# Patient Record
Sex: Female | Born: 1995 | Race: Black or African American | Hispanic: No | Marital: Single
Health system: Midwestern US, Community
[De-identification: ages and names within clinical notes are randomized; demographics above are authoritative.]

## PROBLEM LIST (undated history)

## (undated) DIAGNOSIS — D649 Anemia, unspecified: Secondary | ICD-10-CM

## (undated) DIAGNOSIS — J45909 Unspecified asthma, uncomplicated: Secondary | ICD-10-CM

## (undated) HISTORY — DX: Unspecified asthma, uncomplicated: J45.909

---

## 2016-11-12 ENCOUNTER — Emergency Department (HOSPITAL_COMMUNITY): Payer: Medicaid Other

## 2016-11-12 ENCOUNTER — Emergency Department (HOSPITAL_COMMUNITY)
Admission: EM | Admit: 2016-11-12 | Discharge: 2016-11-13 | Disposition: A | Discharge status: Home / Self Care | Payer: Medicaid Other | Attending: Emergency Medicine | Admitting: Emergency Medicine

## 2016-11-12 ENCOUNTER — Encounter (HOSPITAL_COMMUNITY): Payer: Self-pay

## 2016-11-12 DIAGNOSIS — R0602 Shortness of breath: Secondary | ICD-10-CM

## 2016-11-12 DIAGNOSIS — R7989 Other specified abnormal findings of blood chemistry: Secondary | ICD-10-CM

## 2016-11-12 DIAGNOSIS — O9989 Other specified diseases and conditions complicating pregnancy, childbirth and the puerperium: Secondary | ICD-10-CM | POA: Insufficient documentation

## 2016-11-12 DIAGNOSIS — Z3A09 9 weeks gestation of pregnancy: Secondary | ICD-10-CM | POA: Diagnosis not present

## 2016-11-12 DIAGNOSIS — R791 Abnormal coagulation profile: Secondary | ICD-10-CM | POA: Insufficient documentation

## 2016-11-12 DIAGNOSIS — R071 Chest pain on breathing: Secondary | ICD-10-CM | POA: Diagnosis not present

## 2016-11-12 HISTORY — DX: Anemia, unspecified: D64.9

## 2016-11-12 LAB — BASIC METABOLIC PANEL
Anion gap: 10 (ref 5–15)
BUN: 6 mg/dL (ref 6–20)
CHLORIDE: 105 mmol/L (ref 101–111)
CO2: 20 mmol/L — ABNORMAL LOW (ref 22–32)
CREATININE: 0.6 mg/dL (ref 0.44–1.00)
Calcium: 9.6 mg/dL (ref 8.9–10.3)
GFR calc non Af Amer: >60 mL/min (ref >60)
Glucose, Bld: 104 mg/dL — ABNORMAL HIGH (ref 65–99)
Potassium: 3.7 mmol/L (ref 3.5–5.1)
SODIUM: 135 mmol/L (ref 135–145)

## 2016-11-12 LAB — I-STAT TROPONIN, ED: TROPONIN I, POC: 0 ng/mL (ref 0.00–0.08)

## 2016-11-12 LAB — CBC
HCT: 30.1 % — ABNORMAL LOW (ref 36.0–46.0)
Hemoglobin: 9.3 g/dL — ABNORMAL LOW (ref 12.0–15.0)
MCH: 19.6 pg — ABNORMAL LOW (ref 26.0–34.0)
MCHC: 30.9 g/dL (ref 30.0–36.0)
MCV: 63.5 fL — AB (ref 78.0–100.0)
PLATELETS: 356 10*3/uL (ref 150–400)
RBC: 4.74 MIL/uL (ref 3.87–5.11)
RDW: 19.1 % — ABNORMAL HIGH (ref 11.5–15.5)
WBC: 9.3 10*3/uL (ref 4.0–10.5)

## 2016-11-12 LAB — I-STAT BETA HCG BLOOD, ED (MC, WL, AP ONLY): I-stat hCG, quantitative: >2000 m[IU]/mL — ABNORMAL HIGH (ref <5)

## 2016-11-12 LAB — D-DIMER, QUANTITATIVE: D-Dimer, Quant: 1.06 ug/mL-FEU — ABNORMAL HIGH (ref 0.00–0.50)

## 2016-11-12 NOTE — ED Provider Notes (Signed)
[facility]-EMERGENCY DEPT Provider Note   CSN: [id number] Arrival date & time: 11/12/16  1848     History   Chief Complaint Chief Complaint  Patient presents with  . Chest Pain    HPI Christine Anthony is a 21 y.o. female. Patient is a G1 P0 at approximately [redacted] weeks gestation who presents with chest pain, shortness of breath, and abdominal pain.  She just moved here from [place].  She states she is unsure how far along she is, however she reports she had an ultrasound which confirmed intrauterine pregnancy at the end of January.  She reports that her chest pain started last night.  It is located substernal and feels like a pressure.  She says it feels like her heart is beating out of her chest.  When she walks, she gets short of breath.  She has no history of any heart problems.  No history of DVT or PE.  She denies any leg swelling or leg pain.  She denies any cough, fever, congestion, or chills.  Regarding her abdominal pain, she said it is mild and along both flanks.  It has been going on for several days.  She states that about 2 weeks ago, she had some clear vaginal discharge but this is resolved.  She denies any vaginal pain, dysuria, or vaginal bleeding.    HPI  Past Medical History:  Diagnosis Date  . Anemia     There are no active problems to display for this patient.   History reviewed. No pertinent surgical history.  OB History    Gravida Para Term Preterm AB Living   2 0 0 0 0 0   SAB TAB Ectopic Multiple Live Births   0 0 0 0 0       Home Medications    Prior to Admission medications   Medication Sig Start Date End Date Taking? Authorizing Provider  Prenatal Vit-Fe Fumarate-FA (MULTIVITAMIN-PRENATAL) 27-0.8 MG TABS tablet Take 1 tablet by mouth daily at 12 noon.   Yes Historical Provider, MD    Family History No family history on file.  Social History Social History  Substance Use Topics  . Smoking status: Never Smoker  . Smokeless tobacco: Never Used    . Alcohol use No     Allergies   Patient has no known allergies.   Review of Systems Review of Systems  Constitutional: Negative for chills and fever.  HENT: Negative for ear pain and sore throat.   Eyes: Negative for pain and visual disturbance.  Respiratory: Positive for shortness of breath. Negative for cough.   Cardiovascular: Positive for chest pain. Negative for palpitations and leg swelling.  Gastrointestinal: Positive for abdominal pain and diarrhea. Negative for blood in stool, constipation, nausea and vomiting.  Genitourinary: Positive for vaginal discharge (resolved). Negative for decreased urine volume, difficulty urinating, dysuria, hematuria, vaginal bleeding and vaginal pain.  Musculoskeletal: Negative for arthralgias and back pain.  Skin: Negative for color change and rash.  Neurological: Negative for seizures and syncope.  All other systems reviewed and are negative.    Physical Exam Updated Vital Signs BP 97/71   Pulse 76   Temp 97.1 F (36.2 C) (Oral)   Resp 14   Ht 5\' 1"  (1.549 m)   Wt 57.6 kg   LMP 09/02/2016 (Exact Date)   SpO2 100%   BMI 24.00 kg/m   Physical Exam  Constitutional: She is oriented to person, place, and time. She appears well-developed and well-nourished. No distress.  HENT:  Head: Normocephalic and atraumatic.  Eyes: Conjunctivae are normal.  Neck: Neck supple.  Cardiovascular: Normal rate and regular rhythm.   No murmur heard. Pt became mildly tachycardic (105) while leaning forward  Pulmonary/Chest: Effort normal and breath sounds normal. No respiratory distress. She has no wheezes. She has no rales. She exhibits tenderness (mild tenderness to mid-chest).  Abdominal: Soft. She exhibits no distension and no mass. There is tenderness. There is no rebound and no guarding.  Very mild tenderness to RUQ and LUQ along flanks. No rebound or guarding.  Musculoskeletal: She exhibits no edema, tenderness or deformity.  Neurological:  She is alert and oriented to person, place, and time. She displays normal reflexes. No cranial nerve deficit or sensory deficit. She exhibits normal muscle tone. Coordination normal.  Skin: Skin is warm and dry.  Psychiatric: She has a normal mood and affect.  Nursing note and vitals reviewed.    ED Treatments / Results  Labs (all labs ordered are listed, but only abnormal results are displayed) Labs Reviewed  BASIC METABOLIC PANEL - Abnormal; Notable for the following:       Result Value   CO2 20 (*)    Glucose, Bld 104 (*)    All other components within normal limits  CBC - Abnormal; Notable for the following:    Hemoglobin 9.3 (*)    HCT 30.1 (*)    MCV 63.5 (*)    MCH 19.6 (*)    RDW 19.1 (*)    All other components within normal limits  D-DIMER, QUANTITATIVE (NOT AT [facility]) - Abnormal; Notable for the following:    D-Dimer, Quant 1.06 (*)    All other components within normal limits  I-STAT BETA HCG BLOOD, ED ([facility], [facility], [facility] ONLY) - Abnormal; Notable for the following:    I-stat hCG, quantitative >2,000.0 (*)    All other components within normal limits  URINALYSIS, ROUTINE W REFLEX MICROSCOPIC  HEPATIC FUNCTION PANEL  LIPASE, BLOOD  I-STAT TROPOININ, ED    EKG  EKG Interpretation  Date/Time:  Thursday November 12 2016 18:54:04 EST Ventricular Rate:  105 PR Interval:  132 QRS Duration: 84 QT Interval:  328 QTC Calculation: 433 R Axis:   68 Text Interpretation:  Sinus tachycardia Otherwise normal ECG No old tracing to compare Confirmed by WARD,  DO, KRISTEN ([id number]) on 11/13/2016 12:14:17 AM       Radiology Dg Chest 2 View  Result Date: 11/12/2016 CLINICAL DATA:  Mid sternal chest pain with shortness of breath EXAM: CHEST  2 VIEW COMPARISON:  None. FINDINGS: The heart size and mediastinal contours are within normal limits. Both lungs are clear. The visualized skeletal structures are unremarkable. IMPRESSION: No active cardiopulmonary disease. Electronically Signed   By:  Jasmine Pang M.D.   On: 11/12/2016 22:43    Procedures Procedures (including critical care time)  EMERGENCY DEPARTMENT [place] PREGNANCY "Study: Limited Ultrasound of the Pelvis for Pregnancy"  INDICATIONS:Pregnancy(required) Multiple views of the uterus and pelvic cavity were obtained in real-time with a multi-frequency probe.  APPROACH:Transabdominal  PERFORMED BY: Myself IMAGES ARCHIVED?: Yes LIMITATIONS: none PREGNANCY FREE FLUID: None GESTATIONAL AGE, ESTIMATE: [redacted]w[redacted]d by CRL FETAL HEART RATE: 176 INTERPRETATION: Intrauterine gestational sac noted and Fetal heart activity seen     Medications Ordered in ED Medications  iopamidol (ISOVUE-370) 76 % injection (100 mLs  Contrast Given 11/13/16 0028)     Initial Impression / Assessment and Plan / ED Course  I have reviewed the triage vital signs and  the nursing notes.  Pertinent labs & imaging results that were available during my care of the patient were reviewed by me and considered in my medical decision making (see chart for details).    Patient presents with chest pain, shortness of breath, and mild abdominal pain.  She has not yet seen an OB.  Bedside ultrasound confirmed intrauterine pregnancy at approximately 10 weeks and 6 day gestation.  She has had no vaginal bleeding.  She did have remote clear vaginal discharge 2 weeks ago which has since resolved.  She had reproducible chest pain on exam.  However, she had mild sinus tachycardia and a positive d-dimer.  Her chest x-ray was normal.  She is pending a CTA of her chest to rule out a Pe.  Pt signed out to Dr. Elesa MassedWard at 2330.  Pt has OB follow-up scheduled for Tuesday.  Final Clinical Impressions(s) / ED Diagnoses   Final diagnoses:  Chest pain, unspecified type  Shortness of breath  Positive D dimer    New Prescriptions New Prescriptions   No medications on file     Lennette BihariJohn Michael Synai Prettyman, MD 11/13/16 [id number]    Arby BarretteMarcy Pfeiffer, MD 11/15/16 [phone]

## 2016-11-12 NOTE — ED Notes (Signed)
Rounded on lobby.  Updates and warm blankets provided.   

## 2016-11-12 NOTE — ED Triage Notes (Addendum)
Per Pt, Pt reports central chest pain, SOB, and Heart palpitations that started two days ago. Denies any cough, congestion, N/V, but reports some diarrhea. Pt reports being [redacted] week pregnant. Complains of some abdominal pain and vaginal discharge that is yellow.

## 2016-11-13 ENCOUNTER — Emergency Department (HOSPITAL_COMMUNITY): Payer: Medicaid Other

## 2016-11-13 ENCOUNTER — Encounter (HOSPITAL_COMMUNITY): Payer: Self-pay

## 2016-11-13 LAB — HEPATIC FUNCTION PANEL
ALT: 8 U/L — AB (ref 14–54)
AST: 19 U/L (ref 15–41)
Albumin: 3.9 g/dL (ref 3.5–5.0)
Alkaline Phosphatase: 39 U/L (ref 38–126)
TOTAL PROTEIN: 8 g/dL (ref 6.5–8.1)
Total Bilirubin: 0.7 mg/dL (ref 0.3–1.2)

## 2016-11-13 LAB — URINALYSIS, ROUTINE W REFLEX MICROSCOPIC
BILIRUBIN URINE: NEGATIVE
GLUCOSE, UA: NEGATIVE mg/dL
HGB URINE DIPSTICK: NEGATIVE
KETONES UR: 5 mg/dL — AB
Leukocytes, UA: NEGATIVE
Nitrite: NEGATIVE
PROTEIN: NEGATIVE mg/dL
Specific Gravity, Urine: 1.029 (ref 1.005–1.030)
pH: 6 (ref 5.0–8.0)

## 2016-11-13 LAB — LIPASE, BLOOD: LIPASE: 65 U/L — AB (ref 11–51)

## 2016-11-13 MED ORDER — IOPAMIDOL (ISOVUE-370) INJECTION 76%
INTRAVENOUS | Status: AC
  Administered 2016-11-13: 100 mL
  Filled 2016-11-13: qty 100

## 2016-11-13 NOTE — ED Provider Notes (Signed)
2:10 AM  Assumed care from Dr. Charm BargesButler and Dr. Donnald GarrePfeiffer.  Pt is a 21 year old pregnant female who presented to the emergency department with pleuritic chest pain. CT of her chest was pending to rule out PE. Labs here unremarkable. Urine shows no sign of infection. CT of her chest shows no PE, infiltrate, edema. She appears comfortable on exam, hemodynamically stable without significant complaints. Recommended outpatient follow-up. Recommended taking Tylenol as needed at home for pain.  At this time, I do not feel there is any life-threatening condition present. I have reviewed and discussed all results (EKG, imaging, lab, urine as appropriate) and exam findings with patient/family. I have reviewed nursing notes and appropriate previous records.  I feel the patient is safe to be discharged home without further emergent workup and can continue workup as an outpatient as needed. Discussed usual and customary return precautions. Patient/family verbalize understanding and are comfortable with this plan.  Outpatient follow-up has been provided if needed. All questions have been answered.    EKG Interpretation  Date/Time:  Thursday November 12 2016 18:54:04 EST Ventricular Rate:  105 PR Interval:  132 QRS Duration: 84 QT Interval:  328 QTC Calculation: 433 R Axis:   68 Text Interpretation:  Sinus tachycardia Otherwise normal ECG No old tracing to compare Confirmed by Zarek Relph,  DO, Skylen Danielsen [phone]([id number]) on 11/13/2016 12:14:17 AM         Christine MawKristen N Aretha Levi, DO 11/13/16 0211

## 2016-11-13 NOTE — ED Notes (Signed)
Patient transported to CT via stretcher.

## 2016-11-13 NOTE — ED Notes (Signed)
Sent add on label to main lab. 

## 2016-11-13 NOTE — ED Notes (Signed)
Patient verbalized understanding of discharge instructions and denies any further needs or questions at this time. VS stable. Patient ambulatory with steady gait.  

## 2016-11-13 NOTE — Discharge Instructions (Signed)
You may take Tylenol 1000 g every 6 hours as needed for pain.   To find a primary care or specialty doctor please call [phone] or [phone] to access "[place] Find a Doctor Service."  You may also go on the [facility] website at [email or web address]  There are also multiple [facility], [facility], [facility] and [facility] practices throughout [place] that are frequently accepting new patients. You may find a clinic that is close to your home and contact them.  [facility] -  [address] [place] [phone]   [facility] -  [address] [place] [id number] [phone]   [facility] [phone]  [place] [phone]

## 2016-11-19 ENCOUNTER — Encounter: Payer: Self-pay | Admitting: Certified Nurse Midwife

## 2016-11-19 ENCOUNTER — Other Ambulatory Visit (HOSPITAL_COMMUNITY)
Admission: RE | Admit: 2016-11-19 | Discharge: 2016-11-19 | Disposition: A | Discharge status: Home / Self Care | Payer: Medicaid Other | Source: Ambulatory Visit | Attending: Certified Nurse Midwife | Admitting: Certified Nurse Midwife

## 2016-11-19 ENCOUNTER — Ambulatory Visit (INDEPENDENT_AMBULATORY_CARE_PROVIDER_SITE_OTHER): Payer: Medicaid Other | Admitting: Certified Nurse Midwife

## 2016-11-19 VITALS — BP 110/77 | HR 97 | Temp 98.7°F | Wt 127.6 lb

## 2016-11-19 DIAGNOSIS — J4520 Mild intermittent asthma, uncomplicated: Secondary | ICD-10-CM

## 2016-11-19 DIAGNOSIS — O99511 Diseases of the respiratory system complicating pregnancy, first trimester: Secondary | ICD-10-CM

## 2016-11-19 DIAGNOSIS — Z3490 Encounter for supervision of normal pregnancy, unspecified, unspecified trimester: Secondary | ICD-10-CM | POA: Diagnosis not present

## 2016-11-19 DIAGNOSIS — Z3481 Encounter for supervision of other normal pregnancy, first trimester: Secondary | ICD-10-CM

## 2016-11-19 DIAGNOSIS — O219 Vomiting of pregnancy, unspecified: Secondary | ICD-10-CM

## 2016-11-19 MED ORDER — DOXYLAMINE-PYRIDOXINE 10-10 MG PO TBEC: DELAYED_RELEASE_TABLET | ORAL | 4 refills | Status: AC

## 2016-11-19 MED ORDER — PRENATE PIXIE 10-0.6-0.4-200 MG PO CAPS: 1.0000 | ORAL_CAPSULE | Freq: Every day | ORAL | 12 refills | Status: AC

## 2016-11-19 MED ORDER — ALBUTEROL SULFATE HFA 108 (90 BASE) MCG/ACT IN AERS: 2.0000 | INHALATION_SPRAY | Freq: Four times a day (QID) | RESPIRATORY_TRACT | 1 refills | Status: AC | PRN

## 2016-11-19 NOTE — Progress Notes (Signed)
Subjective:    Christine Anthony is being seen today for her first obstetrical visit.  This is a planned pregnancy. She is at 3082w1d gestation. Her obstetrical history is significant for asthma. Relationship with FOB: significant other, living together. Patient does intend to breast feed. Pregnancy history fully reviewed.  The information documented in the HPI was reviewed and verified.  Menstrual History: OB History    Gravida Para Term Preterm AB Living   1 0 0 0 0 0   SAB TAB Ectopic Multiple Live Births   0 0 0 0 0       Patient's last menstrual period was 09/02/2016 (exact date).    Past Medical History:  Diagnosis Date  . Anemia   . Asthma     History reviewed. No pertinent surgical history.   (Not in a hospital admission) No Known Allergies  Social History  Substance Use Topics  . Smoking status: Never Smoker  . Smokeless tobacco: Never Used  . Alcohol use No    Family History  Problem Relation Age of Onset  . Congestive Heart Failure Maternal Grandmother      Review of Systems Constitutional: negative for weight loss Gastrointestinal: + for vomiting Genitourinary:negative for genital lesions and vaginal discharge and dysuria Musculoskeletal:negative for back pain Behavioral/Psych: negative for abusive relationship, depression, illegal drug usage and tobacco use    Objective:    BP 110/77   Pulse 97   Temp 98.7 F (37.1 C)   Wt 127 lb 9.6 oz (57.9 kg)   LMP 09/02/2016 (Exact Date)   BMI 24.11 kg/m  General Appearance:    Alert, cooperative, no distress, appears stated age  Head:    Normocephalic, without obvious abnormality, atraumatic  Eyes:    PERRL, conjunctiva/corneas clear, EOM's intact, fundi    benign, both eyes  Ears:    Normal TM's and external ear canals, both ears  Nose:   Nares normal, septum midline, mucosa normal, no drainage    or sinus tenderness  Throat:   Lips, mucosa, and tongue normal; teeth and gums normal  Neck:   Supple,  symmetrical, trachea midline, no adenopathy;    thyroid:  no enlargement/tenderness/nodules; no carotid   bruit or JVD  Back:     Symmetric, no curvature, ROM normal, no CVA tenderness  Lungs:     Clear to auscultation bilaterally, respirations unlabored  Chest Wall:    No tenderness or deformity   Heart:    Regular rate and rhythm, S1 and S2 normal, no murmur, rub   or gallop  Breast Exam:    No tenderness, masses, or nipple abnormality  Abdomen:     Soft, non-tender, bowel sounds active all four quadrants,    no masses, no organomegaly  Genitalia:    Normal female without lesion, discharge or tenderness  Extremities:   Extremities normal, atraumatic, no cyanosis or edema  Pulses:   2+ and symmetric all extremities  Skin:   Skin color, texture, turgor normal, no rashes or lesions  Lymph nodes:   Cervical, supraclavicular, and axillary nodes normal  Neurologic:   CNII-XII intact, normal strength, sensation and reflexes    throughout       Cervix:   Long, thick, closed and posterior.  FHR: 165 by doppler.  Size c/w dates.    Lab Review Urine pregnancy test Labs reviewed no Radiologic studies reviewed no Assessment:    Pregnancy at 3682w1d weeks     Encounter for supervision of normal pregnancy, antepartum, unspecified  gravidity - Plan: TSH, Hemoglobinopathy evaluation, Varicella zoster antibody, IgG, Culture, OB Urine, MaterniT21 PLUS Core+SCA, Hemoglobin A1c, Obstetric Panel, Including HIV, Cystic Fibrosis Mutation 97, Prenat-FeAsp-Meth-FA-DHA w/o A (PRENATE PIXIE) 10-0.6-0.4-200 MG CAPS, Cervicovaginal ancillary only  Mild intermittent asthma without complication - Plan: albuterol (PROVENTIL HFA;VENTOLIN HFA) 108 (90 Base) MCG/ACT inhaler  Nausea and vomiting during pregnancy prior to [redacted] weeks gestation - Plan: Doxylamine-Pyridoxine (DICLEGIS) 10-10 MG TBEC   Plan:      Prenatal vitamins.  Counseling provided regarding continued use of seat belts, cessation of alcohol  consumption, smoking or use of illicit drugs; infection precautions i.e., influenza/TDAP immunizations, toxoplasmosis,CMV, parvovirus, listeria and varicella; workplace safety, exercise during pregnancy; routine dental care, safe medications, sexual activity, hot tubs, saunas, pools, travel, caffeine use, fish and methlymercury, potential toxins, hair treatments, varicose veins Weight gain recommendations per IOM guidelines reviewed: underweight/BMI< 18.5--> gain 28 - 40 lbs; normal weight/BMI 18.5 - 24.9--> gain 25 - 35 lbs; overweight/BMI 25 - 29.9--> gain 15 - 25 lbs; obese/BMI >30->gain  11 - 20 lbs Problem list reviewed and updated. FIRST/CF mutation testing/NIPT/QUAD SCREEN/fragile X/Ashkenazi Jewish population testing/Spinal muscular atrophy discussed: ordered. Role of ultrasound in pregnancy discussed; fetal survey: requested. Amniocentesis discussed: not indicated.  Meds ordered this encounter  Medications  . Prenat-FeAsp-Meth-FA-DHA w/o A (PRENATE PIXIE) 10-0.6-0.4-200 MG CAPS    Sig: Take 1 tablet by mouth daily.    Dispense:  30 capsule    Refill:  12    Please process coupon: Rx BIN: [id number], RxPCN: [id number], RxGRP: [id number], RxID: [id number]  SUF: 01  . albuterol (PROVENTIL HFA;VENTOLIN HFA) 108 (90 Base) MCG/ACT inhaler    Sig: Inhale 2 puffs into the lungs every 6 (six) hours as needed for wheezing or shortness of breath.    Dispense:  18 g    Refill:  1  . Doxylamine-Pyridoxine (DICLEGIS) 10-10 MG TBEC    Sig: Take 1 tablet with breakfast and lunch.  Take 2 tablets at bedtime.    Dispense:  100 tablet    Refill:  4   Orders Placed This Encounter  Procedures  . Culture, OB Urine  . TSH  . Hemoglobinopathy evaluation  . Varicella zoster antibody, IgG  . MaterniT21 PLUS Core+SCA    Order Specific Question:   Is the patient insulin dependent?    Answer:   No    Order Specific Question:   Please enter gestational age. This should be expressed as weeks AND days, i.e. 16w 6d.  Enter weeks here. Enter days in next question.    Answer:   61    Order Specific Question:   Please enter gestational age. This should be expressed as weeks AND days, i.e. 16w 6d. Enter days here. Enter weeks in previous question.    Answer:   1    Order Specific Question:   How was gestational age calculated?    Answer:   LMP    Order Specific Question:   Please give the date of LMP OR Ultrasound OR Estimated date of delivery.    Answer:   06/09/2017    Order Specific Question:   Number of Fetuses (Type of Pregnancy):    Answer:   1    Order Specific Question:   Indications for performing the test? (please choose all that apply):    Answer:   Routine screening    Order Specific Question:   Other Indications? (Y=Yes, N=No)    Answer:   N    Order Specific Question:  If this is a repeat specimen, please indicate the reason:    Answer:   Not indicated    Order Specific Question:   Please specify the patient's race: (C=White/Caucasion, B=Black, I=Native American, A=Asian, H=Hispanic, O=Other, U=Unknown)    Answer:   B    Order Specific Question:   Donor Egg - indicate if the egg was obtained from in vitro fertilization.    Answer:   N    Order Specific Question:   Age of Egg Donor.    Answer:   3    Order Specific Question:   Prior Down Syndrome/ONTD screening during current pregnancy.    Answer:   N    Order Specific Question:   Prior First Trimester Testing    Answer:   N    Order Specific Question:   Prior Second Trimester Testing    Answer:   N    Order Specific Question:   Family History of Neural Tube Defects    Answer:   N    Order Specific Question:   Prior Pregnancy with Down Syndrome    Answer:   N    Order Specific Question:   Please give the patient's weight (in pounds)    Answer:   127  . Hemoglobin A1c  . Obstetric Panel, Including HIV  . Cystic Fibrosis Mutation 97    Follow up in 4 weeks. 50% of 30 min visit spent on counseling and coordination of care.

## 2016-11-19 NOTE — Progress Notes (Signed)
Patient is in the office for initial ob visit, reports being tired.

## 2016-11-21 LAB — CERVICOVAGINAL ANCILLARY ONLY
BACTERIAL VAGINITIS: POSITIVE — AB
CANDIDA VAGINITIS: POSITIVE — AB
Chlamydia: NEGATIVE
Neisseria Gonorrhea: NEGATIVE
Trichomonas: NEGATIVE

## 2016-11-23 ENCOUNTER — Other Ambulatory Visit: Payer: Self-pay | Admitting: Certified Nurse Midwife

## 2016-11-23 DIAGNOSIS — B9689 Other specified bacterial agents as the cause of diseases classified elsewhere: Secondary | ICD-10-CM

## 2016-11-23 DIAGNOSIS — B373 Candidiasis of vulva and vagina: Secondary | ICD-10-CM

## 2016-11-23 DIAGNOSIS — B3731 Acute candidiasis of vulva and vagina: Secondary | ICD-10-CM

## 2016-11-23 DIAGNOSIS — N760 Acute vaginitis: Principal | ICD-10-CM

## 2016-11-23 MED ORDER — TERCONAZOLE 0.8 % VA CREA: 1.0000 | TOPICAL_CREAM | Freq: Every day | VAGINAL | 0 refills | Status: AC

## 2016-11-23 MED ORDER — METRONIDAZOLE 0.75 % VA GEL: 1.0000 | Freq: Two times a day (BID) | VAGINAL | 0 refills | Status: AC

## 2016-11-24 LAB — HEMOGLOBINOPATHY EVALUATION
HEMOGLOBIN F QUANTITATION: 0 % (ref 0.0–2.0)
HGB A: 97.8 % (ref 96.4–98.8)
HGB C: 0 %
HGB S: 0 %
HGB VARIANT: 0 %
Hemoglobin A2 Quantitation: 2.2 % (ref 1.8–3.2)

## 2016-11-24 LAB — OBSTETRIC PANEL, INCLUDING HIV
Antibody Screen: NEGATIVE
BASOS ABS: 0 10*3/uL (ref 0.0–0.2)
Basos: 0 %
EOS (ABSOLUTE): 0.1 10*3/uL (ref 0.0–0.4)
EOS: 1 %
HEMATOCRIT: 32.3 % — AB (ref 34.0–46.6)
HEMOGLOBIN: 9.6 g/dL — AB (ref 11.1–15.9)
HIV SCREEN 4TH GENERATION: NONREACTIVE
Hepatitis B Surface Ag: NEGATIVE
Immature Grans (Abs): 0 10*3/uL (ref 0.0–0.1)
Immature Granulocytes: 0 %
LYMPHS ABS: 1.9 10*3/uL (ref 0.7–3.1)
LYMPHS: 24 %
MCH: 19.4 pg — AB (ref 26.6–33.0)
MCHC: 29.7 g/dL — AB (ref 31.5–35.7)
MCV: 65 fL — AB (ref 79–97)
Monocytes Absolute: 0.4 10*3/uL (ref 0.1–0.9)
Monocytes: 5 %
NEUTROS ABS: 5.6 10*3/uL (ref 1.4–7.0)
Neutrophils: 70 %
Platelets: 346 10*3/uL (ref 150–379)
RBC: 4.94 x10E6/uL (ref 3.77–5.28)
RDW: 20.5 % — AB (ref 12.3–15.4)
RH TYPE: POSITIVE
RPR Ser Ql: NONREACTIVE
Rubella Antibodies, IGG: 9.38 index (ref >0.99)
WBC: 8 10*3/uL (ref 3.4–10.8)

## 2016-11-24 LAB — TSH: TSH: 3.57 u[IU]/mL (ref 0.450–4.500)

## 2016-11-24 LAB — VARICELLA ZOSTER ANTIBODY, IGG: Varicella zoster IgG: <135 index — ABNORMAL LOW (ref >165)

## 2016-11-24 LAB — HEMOGLOBIN A1C: Est. average glucose Bld gHb Est-mCnc: <74 mg/dL

## 2016-11-26 ENCOUNTER — Other Ambulatory Visit: Payer: Self-pay | Admitting: Certified Nurse Midwife

## 2016-11-26 DIAGNOSIS — O99019 Anemia complicating pregnancy, unspecified trimester: Secondary | ICD-10-CM | POA: Insufficient documentation

## 2016-11-26 DIAGNOSIS — Z283 Underimmunization status: Secondary | ICD-10-CM

## 2016-11-26 DIAGNOSIS — O09899 Supervision of other high risk pregnancies, unspecified trimester: Secondary | ICD-10-CM

## 2016-11-26 DIAGNOSIS — Z3400 Encounter for supervision of normal first pregnancy, unspecified trimester: Secondary | ICD-10-CM

## 2016-11-26 DIAGNOSIS — O99011 Anemia complicating pregnancy, first trimester: Secondary | ICD-10-CM

## 2016-11-26 MED ORDER — FERROUS GLUCONATE 240 (27 FE) MG PO TABS: 240.0000 mg | ORAL_TABLET | Freq: Three times a day (TID) | ORAL | 5 refills | Status: AC

## 2016-11-27 ENCOUNTER — Other Ambulatory Visit: Payer: Self-pay | Admitting: Certified Nurse Midwife

## 2016-11-27 DIAGNOSIS — Z3400 Encounter for supervision of normal first pregnancy, unspecified trimester: Secondary | ICD-10-CM

## 2016-11-27 LAB — MATERNIT21 PLUS CORE+SCA
CHROMOSOME 13: NEGATIVE
CHROMOSOME 21: NEGATIVE
Chromosome 18: NEGATIVE
Y Chromosome: DETECTED

## 2016-11-27 LAB — CYSTIC FIBROSIS MUTATION 97: Interpretation: NOT DETECTED

## 2016-12-17 ENCOUNTER — Ambulatory Visit (INDEPENDENT_AMBULATORY_CARE_PROVIDER_SITE_OTHER): Payer: Medicaid Other | Admitting: Obstetrics & Gynecology

## 2016-12-17 VITALS — BP 109/75 | HR 92 | Wt 133.0 lb

## 2016-12-17 DIAGNOSIS — Z3402 Encounter for supervision of normal first pregnancy, second trimester: Secondary | ICD-10-CM

## 2016-12-17 DIAGNOSIS — Z3689 Encounter for other specified antenatal screening: Secondary | ICD-10-CM

## 2016-12-17 DIAGNOSIS — Z3400 Encounter for supervision of normal first pregnancy, unspecified trimester: Secondary | ICD-10-CM

## 2016-12-17 NOTE — Patient Instructions (Signed)
Return to clinic for any scheduled appointments or obstetric concerns, or go to [facility] for evaluation    Second Trimester of Pregnancy The second trimester is from week 14 through week 27 (months 4 through 6). The second trimester is often a time when you feel your best. Your body has adjusted to being pregnant, and you begin to feel better physically. Usually, morning sickness has lessened or quit completely, you may have more energy, and you may have an increase in appetite. The second trimester is also a time when the fetus is growing rapidly. At the end of the sixth month, the fetus is about 9 inches long and weighs about 1 pounds. You will likely begin to feel the baby move (quickening) between 16 and 20 weeks of pregnancy. Body changes during your second trimester Your body continues to go through many changes during your second trimester. The changes vary from woman to woman.  Your weight will continue to increase. You will notice your lower abdomen bulging out.  You may begin to get stretch marks on your hips, abdomen, and breasts.  You may develop headaches that can be relieved by medicines. The medicines should be approved by your health care provider.  You may urinate more often because the fetus is pressing on your bladder.  You may develop or continue to have heartburn as a result of your pregnancy.  You may develop constipation because certain hormones are causing the muscles that push waste through your intestines to slow down.  You may develop hemorrhoids or swollen, bulging veins (varicose veins).  You may have back pain. This is caused by: ? Weight gain. ? Pregnancy hormones that are relaxing the joints in your pelvis. ? A shift in weight and the muscles that support your balance.  Your breasts will continue to grow and they will continue to become tender.  Your gums may bleed and may be sensitive to brushing and flossing.  Dark spots or blotches (chloasma, mask of  pregnancy) may develop on your face. This will likely fade after the baby is born.  A dark line from your belly button to the pubic area (linea nigra) may appear. This will likely fade after the baby is born.  You may have changes in your hair. These can include thickening of your hair, rapid growth, and changes in texture. Some women also have hair loss during or after pregnancy, or hair that feels dry or thin. Your hair will most likely return to normal after your baby is born.  What to expect at prenatal visits During a routine prenatal visit:  You will be weighed to make sure you and the fetus are growing normally.  Your blood pressure will be taken.  Your abdomen will be measured to track your baby's growth.  The fetal heartbeat will be listened to.  Any test results from the previous visit will be discussed.  Your health care provider may ask you:  How you are feeling.  If you are feeling the baby move.  If you have had any abnormal symptoms, such as leaking fluid, bleeding, severe headaches, or abdominal cramping.  If you are using any tobacco products, including cigarettes, chewing tobacco, and electronic cigarettes.  If you have any questions.  Other tests that may be performed during your second trimester include:  Blood tests that check for: ? Low iron levels (anemia). ? High blood sugar that affects pregnant women (gestational diabetes) between 24 and 28 weeks. ? Rh antibodies. This is to   check for a protein on red blood cells (Rh factor).  Urine tests to check for infections, diabetes, or protein in the urine.  An ultrasound to confirm the proper growth and development of the baby.  An amniocentesis to check for possible genetic problems.  Fetal screens for spina bifida and Down syndrome.  HIV (human immunodeficiency virus) testing. Routine prenatal testing includes screening for HIV, unless you choose not to have this test.  Follow these instructions at  home: Medicines  Follow your health care provider's instructions regarding medicine use. Specific medicines may be either safe or unsafe to take during pregnancy.  Take a prenatal vitamin that contains at least 600 micrograms (mcg) of folic acid.  If you develop constipation, try taking a stool softener if your health care provider approves. Eating and drinking  Eat a balanced diet that includes fresh fruits and vegetables, whole grains, good sources of protein such as meat, eggs, or tofu, and low-fat dairy. Your health care provider will help you determine the amount of weight gain that is right for you.  Avoid raw meat and uncooked cheese. These carry germs that can cause birth defects in the baby.  If you have low calcium intake from food, talk to your health care provider about whether you should take a daily calcium supplement.  Limit foods that are high in fat and processed sugars, such as fried and sweet foods.  To prevent constipation: ? Drink enough fluid to keep your urine clear or pale yellow. ? Eat foods that are high in fiber, such as fresh fruits and vegetables, whole grains, and beans. Activity  Exercise only as directed by your health care provider. Most women can continue their usual exercise routine during pregnancy. Try to exercise for 30 minutes at least 5 days a week. Stop exercising if you experience uterine contractions.  Avoid heavy lifting, wear low heel shoes, and practice good posture.  A sexual relationship may be continued unless your health care provider directs you otherwise. Relieving pain and discomfort  Wear a good support bra to prevent discomfort from breast tenderness.  Take warm sitz baths to soothe any pain or discomfort caused by hemorrhoids. Use hemorrhoid cream if your health care provider approves.  Rest with your legs elevated if you have leg cramps or low back pain.  If you develop varicose veins, wear support hose. Elevate your feet  for 15 minutes, 3-4 times a day. Limit salt in your diet. Prenatal Care  Write down your questions. Take them to your prenatal visits.  Keep all your prenatal visits as told by your health care provider. This is important. Safety  Wear your seat belt at all times when driving.  Make a list of emergency phone numbers, including numbers for family, friends, the hospital, and police and fire departments. General instructions  Ask your health care provider for a referral to a local prenatal education class. Begin classes no later than the beginning of month 6 of your pregnancy.  Ask for help if you have counseling or nutritional needs during pregnancy. Your health care provider can offer advice or refer you to specialists for help with various needs.  Do not use hot tubs, steam rooms, or saunas.  Do not douche or use tampons or scented sanitary pads.  Do not cross your legs for long periods of time.  Avoid cat litter boxes and soil used by cats. These carry germs that can cause birth defects in the baby and possibly loss of the   fetus by miscarriage or stillbirth.  Avoid all smoking, herbs, alcohol, and unprescribed drugs. Chemicals in these products can affect the formation and growth of the baby.  Do not use any products that contain nicotine or tobacco, such as cigarettes and e-cigarettes. If you need help quitting, ask your health care provider.  Visit your dentist if you have not gone yet during your pregnancy. Use a soft toothbrush to brush your teeth and be gentle when you floss. Contact a health care provider if:  You have dizziness.  You have mild pelvic cramps, pelvic pressure, or nagging pain in the abdominal area.  You have persistent nausea, vomiting, or diarrhea.  You have a bad smelling vaginal discharge.  You have pain when you urinate. Get help right away if:  You have a fever.  You are leaking fluid from your vagina.  You have spotting or bleeding from your  vagina.  You have severe abdominal cramping or pain.  You have rapid weight gain or weight loss.  You have shortness of breath with chest pain.  You notice sudden or extreme swelling of your face, hands, ankles, feet, or legs.  You have not felt your baby move in over an hour.  You have severe headaches that do not go away when you take medicine.  You have vision changes. Summary  The second trimester is from week 14 through week 27 (months 4 through 6). It is also a time when the fetus is growing rapidly.  Your body goes through many changes during pregnancy. The changes vary from woman to woman.  Avoid all smoking, herbs, alcohol, and unprescribed drugs. These chemicals affect the formation and growth your baby.  Do not use any tobacco products, such as cigarettes, chewing tobacco, and e-cigarettes. If you need help quitting, ask your health care provider.  Contact your health care provider if you have any questions. Keep all prenatal visits as told by your health care provider. This is important. This information is not intended to replace advice given to you by your health care provider. Make sure you discuss any questions you have with your health care provider. Document Released: 08/18/2001 Document Revised: 01/30/2016 Document Reviewed: 10/25/2012 Elsevier Interactive Patient Education  2017 [employer]  

## 2016-12-17 NOTE — Progress Notes (Signed)
   PRENATAL VISIT NOTE  Subjective:  Christine Anthony is a 21 y.o. G1P0000 at [redacted]w[redacted]d being seen today for ongoing prenatal care.  She is currently monitored for the following issues for this low-risk pregnancy and has Supervision of normal pregnancy, antepartum; Maternal varicella, non-immune; and Anemia affecting pregnancy on her problem list.  Patient reports no complaints.  Contractions: Irritability. Vag. Bleeding: None.  Movement: Present. Denies leaking of fluid.   The following portions of the patient's history were reviewed and updated as appropriate: allergies, current medications, past family history, past medical history, past social history, past surgical history and problem list. Problem list updated.  Objective:   Vitals:   12/17/16 1128  BP: 109/75  Pulse: 92  Weight: 133 lb (60.3 kg)    Fetal Status: Fetal Heart Rate (bpm): 165   Movement: Present     General:  Alert, oriented and cooperative. Patient is in no acute distress.  Skin: Skin is warm and dry. No rash noted.   Cardiovascular: Normal heart rate noted  Respiratory: Normal respiratory effort, no problems with respiration noted  Abdomen: Soft, gravid, appropriate for gestational age. Pain/Pressure: Absent     Pelvic:  Cervical exam deferred        Extremities: Normal range of motion.  Edema: None  Mental Status: Normal mood and affect. Normal behavior. Normal judgment and thought content.   Assessment and Plan:  Pregnancy: G1P0000 at [redacted]w[redacted]d  1. Encounter for fetal anatomic survey Anatomy scan ordered. Normal NIPS. - [place] MFM OB COMP + 14 WK; Future  2. Supervision of normal first pregnancy, antepartum Take PNV and iron as recommended. Patient may be moving to [place] soon. No other complaints or concerns.  Routine obstetric precautions reviewed. Please refer to After Visit Summary for other counseling recommendations.  Return in about 4 weeks (around 01/14/2017) for OB Visit.   Tereso Newcomer,  MD

## 2016-12-17 NOTE — Progress Notes (Signed)
Pt reports feeling fetal flutter movements, denies pain. Pt also states that she has not been taking PNV and iron the way that she should.

## 2017-01-06 ENCOUNTER — Other Ambulatory Visit: Payer: Self-pay | Admitting: Obstetrics & Gynecology

## 2017-01-06 ENCOUNTER — Ambulatory Visit (HOSPITAL_COMMUNITY)
Admission: RE | Admit: 2017-01-06 | Discharge: 2017-01-06 | Disposition: A | Discharge status: Home / Self Care | Payer: Medicaid Other | Source: Ambulatory Visit | Attending: Obstetrics & Gynecology | Admitting: Obstetrics & Gynecology

## 2017-01-06 DIAGNOSIS — Z3A18 18 weeks gestation of pregnancy: Secondary | ICD-10-CM

## 2017-01-06 DIAGNOSIS — J4520 Mild intermittent asthma, uncomplicated: Secondary | ICD-10-CM | POA: Diagnosis not present

## 2017-01-06 DIAGNOSIS — O9952 Diseases of the respiratory system complicating childbirth: Secondary | ICD-10-CM | POA: Insufficient documentation

## 2017-01-06 DIAGNOSIS — Z3689 Encounter for other specified antenatal screening: Secondary | ICD-10-CM | POA: Insufficient documentation

## 2017-09-01 ENCOUNTER — Encounter (HOSPITAL_COMMUNITY): Payer: Self-pay

## 2017-11-25 IMAGING — CT CT ANGIO CHEST
1 of 9 series · 17 of 36 positions shown · IV contrast (Iohexol (Omnipaque 350))
Comparison: Radiographs 11/12/2016

CLINICAL DATA: Palpitations, dyspnea and central chest pain for 2
days.

EXAM:
CT ANGIOGRAPHY CHEST WITH CONTRAST
TECHNIQUE: Multidetector CT imaging of the chest was performed using the
standard protocol during bolus administration of intravenous
contrast. Multiplanar CT image reconstructions and MIPs were
obtained to evaluate the vascular anatomy.
CONTRAST:  100 mL Isovue 370 intravenous

[Series 506: thins pacs · axial · 0.60mm/px · z∈[+587,+812]mm · 17 of 255 slices shown]
[im 15/255  lung]
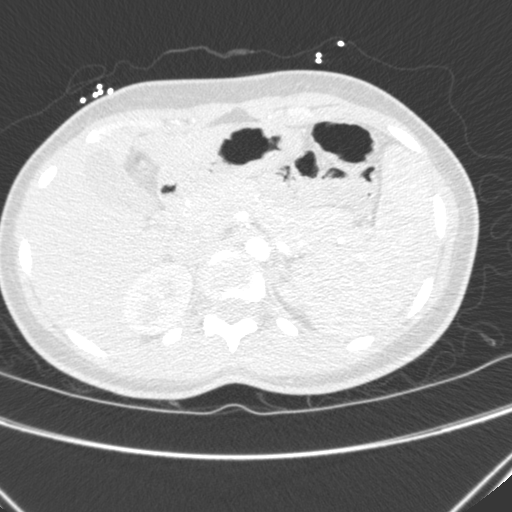
[im 29/255  mediastinal]
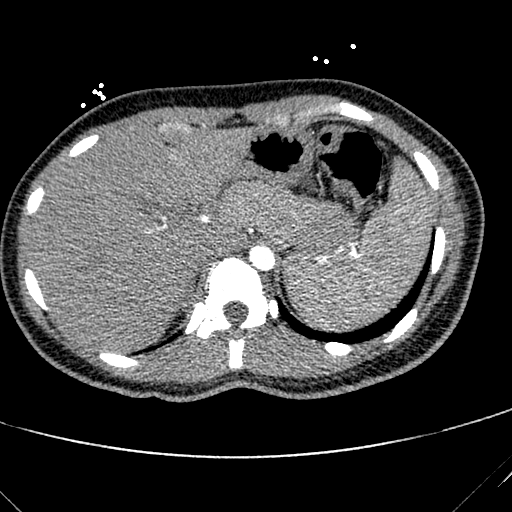
[im 43/255  lung]
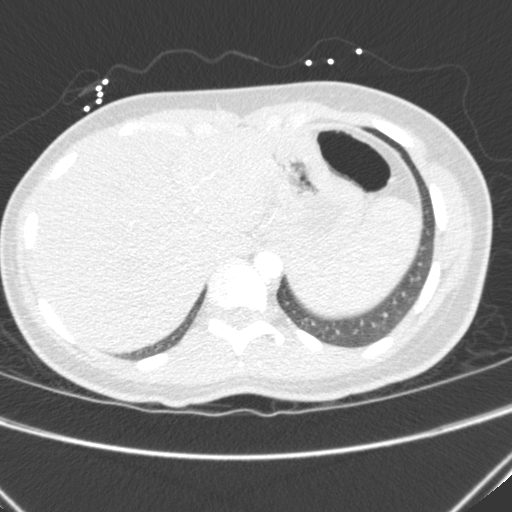
[im 57/255  mediastinal]
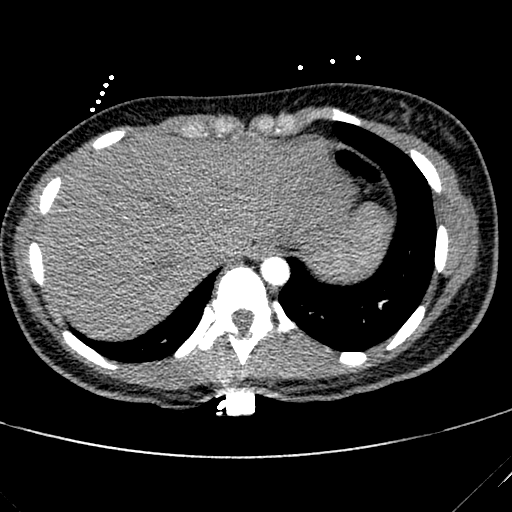
[im 71/255  lung]
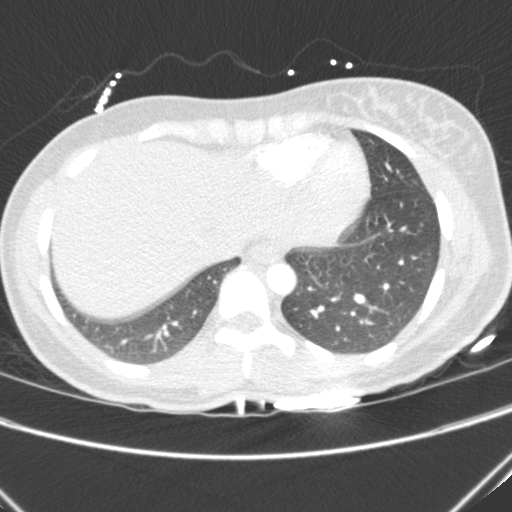
[im 85/255  mediastinal]
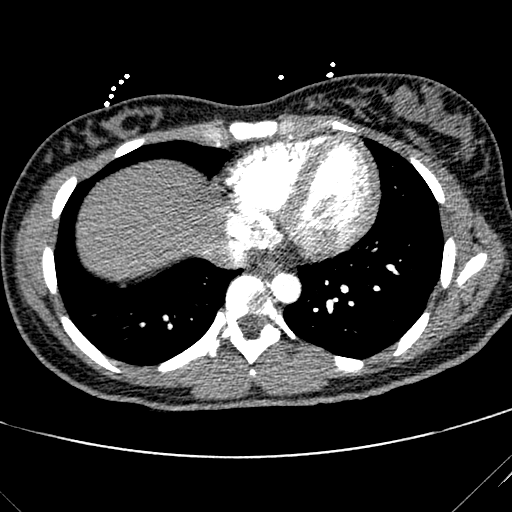
[im 99/255  lung]
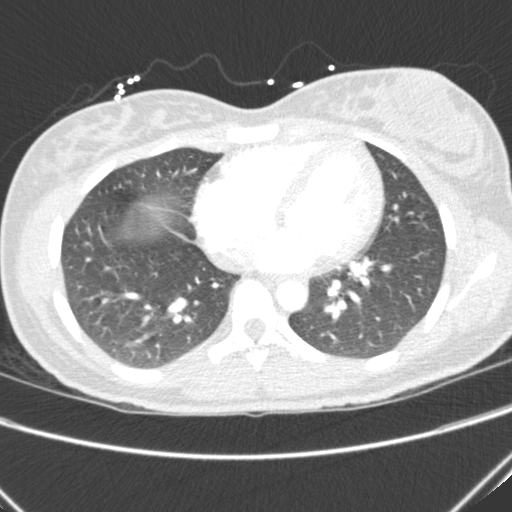
[im 113/255  mediastinal]
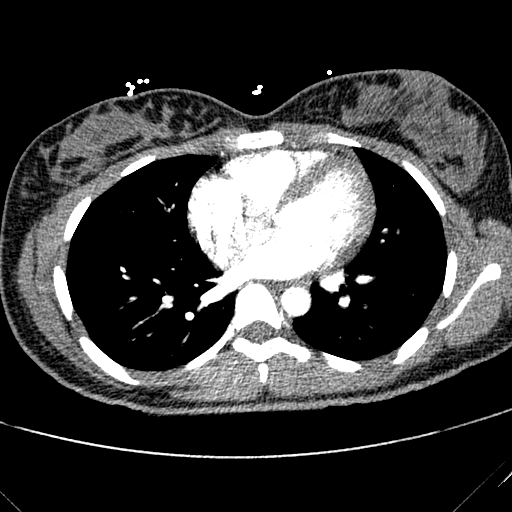
[im 128/255  lung]
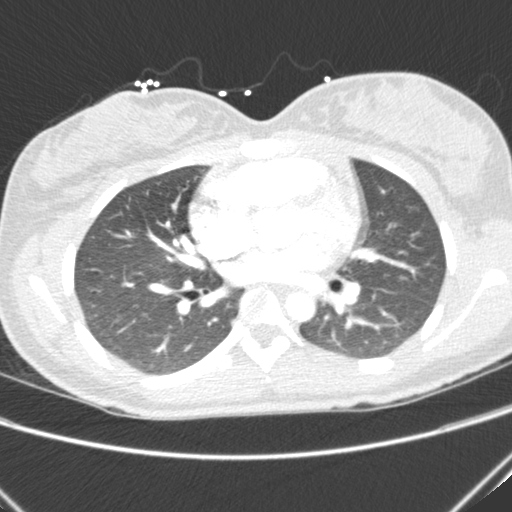
[im 142/255  mediastinal]
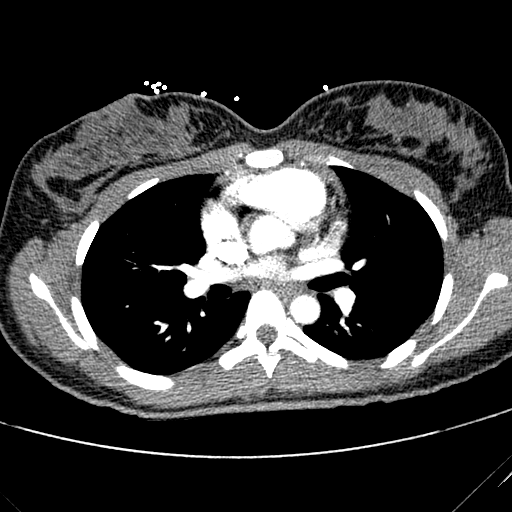
[im 156/255  lung]
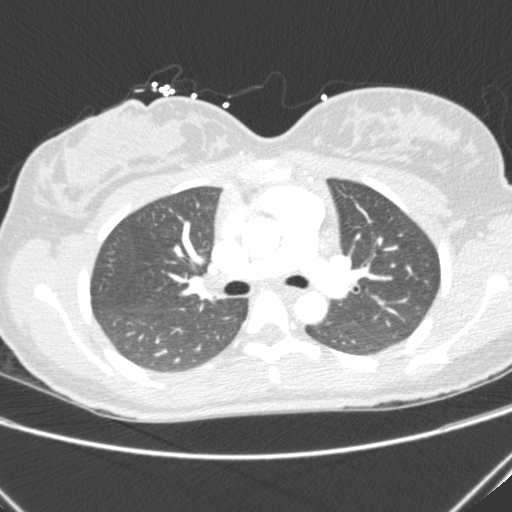
[im 170/255  mediastinal]
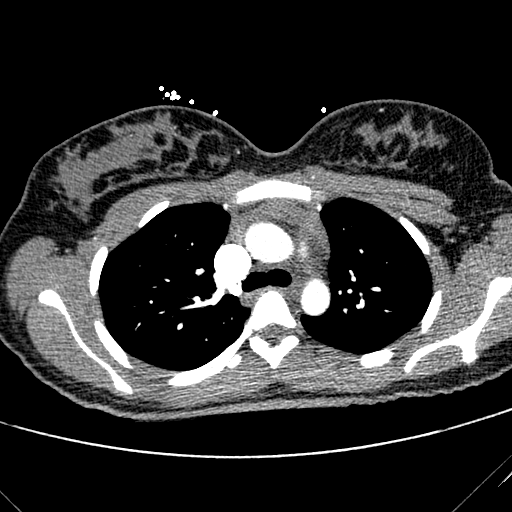
[im 184/255  lung]
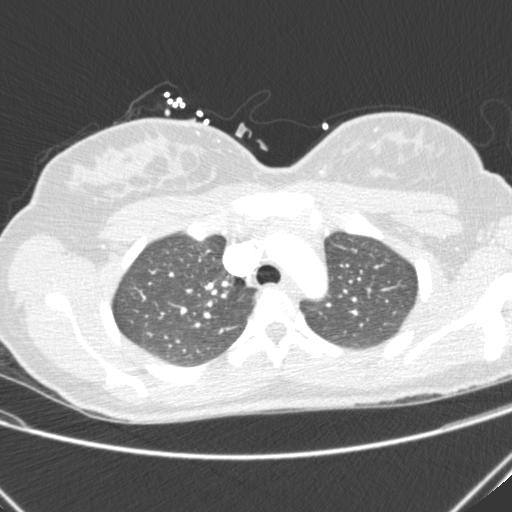
[im 198/255  mediastinal]
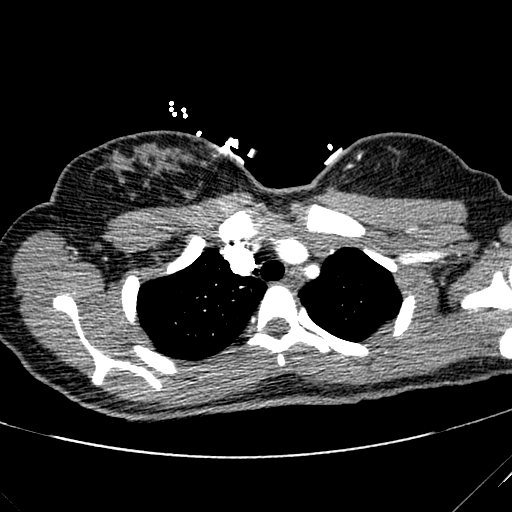
[im 212/255  lung]
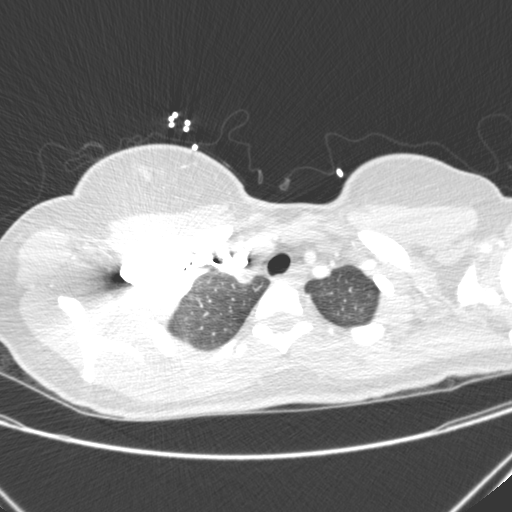
[im 226/255  mediastinal]
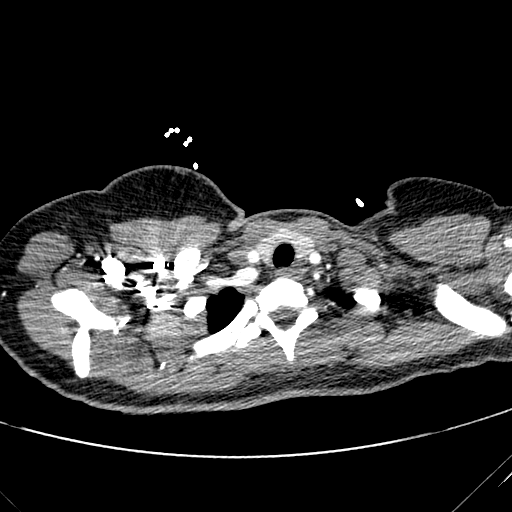
[im 240/255  lung]
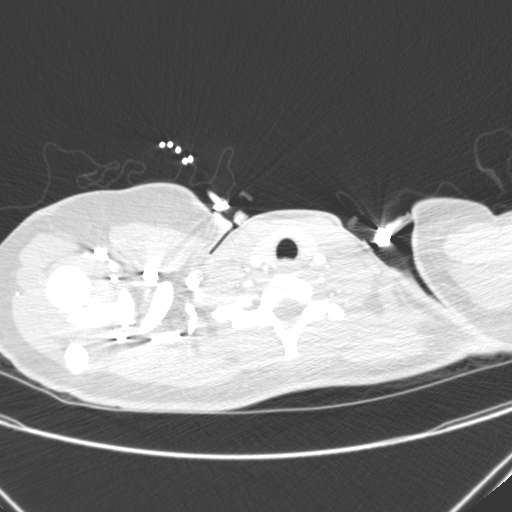

[17 of 36 positions shown; findings below may reference images not displayed]

FINDINGS: Cardiovascular: Satisfactory opacification of the pulmonary arteries
to the segmental level. No evidence of pulmonary embolism. Normal
heart size. No pericardial effusion.

Mediastinum/Nodes: No enlarged mediastinal, hilar, or axillary lymph
nodes. Thyroid gland, trachea, and esophagus demonstrate no
significant findings.

Lungs/Pleura: Lungs are clear. No pleural effusion or pneumothorax.

Upper Abdomen: No acute abnormality.

Musculoskeletal: No chest wall abnormality. No acute or significant
osseous findings.

Review of the MIP images confirms the above findings.
IMPRESSION: No significant abnormality.  No pulmonary emboli.

## 2018-01-20 NOTE — Behavioral Health Treatment Team (Signed)
Missed Appointment Note    Joanna Rubio  25-Oct-1995  [id number]    Service:  [facility]  Missed appointment type:  No show  Reason: Unknown    [name]  01/20/2018  2:58 PM

## 2018-01-21 ENCOUNTER — Inpatient Hospital Stay: Payer: MEDICAID
